# Patient Record
Sex: Female | Born: 1950 | Race: White | Hispanic: No | Marital: Married | Smoking: Never smoker
Health system: Southern US, Community
[De-identification: ages and names within clinical notes are randomized; demographics above are authoritative.]

## PROBLEM LIST (undated history)

## (undated) DIAGNOSIS — T8140XA Infection following a procedure, unspecified, initial encounter: Secondary | ICD-10-CM

## (undated) DIAGNOSIS — T8859XA Other complications of anesthesia, initial encounter: Secondary | ICD-10-CM

## (undated) DIAGNOSIS — I10 Essential (primary) hypertension: Secondary | ICD-10-CM

## (undated) DIAGNOSIS — A4902 Methicillin resistant Staphylococcus aureus infection, unspecified site: Secondary | ICD-10-CM

## (undated) DIAGNOSIS — C801 Malignant (primary) neoplasm, unspecified: Secondary | ICD-10-CM

## (undated) DIAGNOSIS — T4145XA Adverse effect of unspecified anesthetic, initial encounter: Secondary | ICD-10-CM

## (undated) DIAGNOSIS — Z9889 Other specified postprocedural states: Secondary | ICD-10-CM

## (undated) DIAGNOSIS — R112 Nausea with vomiting, unspecified: Secondary | ICD-10-CM

## (undated) HISTORY — PX: TUBAL LIGATION: SHX77

---

## 2015-08-12 DIAGNOSIS — C801 Malignant (primary) neoplasm, unspecified: Secondary | ICD-10-CM

## 2015-08-12 DIAGNOSIS — A4902 Methicillin resistant Staphylococcus aureus infection, unspecified site: Secondary | ICD-10-CM

## 2015-08-12 HISTORY — DX: Malignant (primary) neoplasm, unspecified: C80.1

## 2015-08-12 HISTORY — PX: BREAST LUMPECTOMY: SHX2

## 2015-08-12 HISTORY — DX: Methicillin resistant Staphylococcus aureus infection, unspecified site: A49.02

## 2015-08-12 HISTORY — PX: BREAST SURGERY: SHX581

## 2017-12-09 HISTORY — PX: BREAST EXCISIONAL BIOPSY: SUR124

## 2017-12-11 ENCOUNTER — Other Ambulatory Visit: Payer: Self-pay | Admitting: Hematology & Oncology

## 2017-12-11 DIAGNOSIS — N6310 Unspecified lump in the right breast, unspecified quadrant: Secondary | ICD-10-CM

## 2017-12-11 DIAGNOSIS — R921 Mammographic calcification found on diagnostic imaging of breast: Secondary | ICD-10-CM

## 2017-12-16 ENCOUNTER — Ambulatory Visit
Admission: RE | Admit: 2017-12-16 | Discharge: 2017-12-16 | Disposition: A | Discharge status: Home / Self Care | Payer: Medicare Other | Source: Ambulatory Visit | Attending: Hematology & Oncology | Admitting: Hematology & Oncology

## 2017-12-16 DIAGNOSIS — N6310 Unspecified lump in the right breast, unspecified quadrant: Secondary | ICD-10-CM

## 2017-12-16 DIAGNOSIS — R921 Mammographic calcification found on diagnostic imaging of breast: Secondary | ICD-10-CM

## 2017-12-28 ENCOUNTER — Ambulatory Visit: Payer: Self-pay | Admitting: Surgery

## 2017-12-28 DIAGNOSIS — D241 Benign neoplasm of right breast: Secondary | ICD-10-CM

## 2017-12-28 NOTE — H&P (Signed)
Wendy Kane Documented: 12/28/2017 9:47 AM Location: [facility] Patient #: [id number] DOB: 1950-09-11 Married / Language: English / Race: White Female  History of Present Illness (Wendy Hilgert A. Bodin Gorka MD; 12/28/2017 10:59 AM) Patient words: papilloma     Patient is at at the request of [facility] [place] due to abnormal mammogram. The patient is a history of right breast cancer treated in the [place] in 2017 with lumpectomy and sentinel lymph node mapping on the right. 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 Of note it was placed and had to be removed afterwards. This is quite dramatic for her. She follows up with impression of [place] every 6 months for diagnostic mammography. She was noted to have dystrophic calcifications in the right and a second area of abnormality. Both were core biopsy. The upper outer region showed a papilloma without atypia. The more central reason her lumpectomy site showed dystrophic calcifications. She was sent at the request of the [facility] for lumpectomy for the papilloma given her history. She underwent genetic testing which was negative back in 2017. She has 3 first-degree relatives who had breast cancer in her 66s. Patient denies any recent history of breast pain breast mass or nipple discharge.                         Diagnosis 1. Breast, right, needle core biopsy, 12 o'clock, 3cmfn, slightly medial to midline - SMALL INTRADUCTAL PAPILLOMA. - FIBROCYSTIC CHANGES WITH USUAL DUCTAL HYPERPLASIA. - PSEUDOANGIOMATOUS STROMAL HYPERPLASIA ([place]). - NO EVIDENCE OF MALIGNANCY. 2. Breast, right, needle core biopsy, central upper outer quadrant, along anterior margin of lumpectomy scar, stereotactic - FAT NECROSIS WITH DYSTROPHIC CALCIFICATIONS. - FIBROCYSTIC CHANGES. - NO EVIDENCE OF MALIGNANCY.  The patient is a 67 year old female.   Past  Surgical History Wendy Kane, CMA; 12/28/2017 9:48 AM) Breast Biopsy Right. Breast Mass; Local Excision Right. Colon Polyp Removal - Colonoscopy  Diagnostic Studies History Wendy Kane, CMA; 12/28/2017 9:48 AM) Colonoscopy 1-5 years ago Mammogram within last year Pap Smear 1-5 years ago  Allergies Wendy Kane, CMA; 12/28/2017 9:48 AM) Adhesive Paper *MEDICAL DEVICES AND SUPPLIES* Codeine Phosphate *ANALGESICS - OPIOID*  Medication History (Michelle R. Kane, CMA; 12/28/2017 9:50 AM) Bisoprolol-Hydrochlorothiazide (2.5-6.25MG  Tablet, Oral) Active. Medications Reconciled  Social History Wendy Kane, CMA; 12/28/2017 9:48 AM) Caffeine use Carbonated beverages, Coffee, Tea. No alcohol use No drug use Tobacco use Never smoker.  Family History Wendy Lull R. Wendy Kane, CMA; 12/28/2017 9:48 AM) Breast Cancer Family Members In General. Depression Mother. Diabetes Mellitus Family Members In General. Heart Disease Father. Hypertension Father, Mother. Respiratory Condition Father.  Pregnancy / Birth History Wendy Lull R. Wendy Kane, CMA; 12/28/2017 9:48 AM) Age at menarche 36 years. Age of menopause 31-50 Contraceptive History Oral contraceptives. Gravida 3 Irregular periods Maternal age 23-25 Para 3  Other Problems Wendy Kane, CMA; 12/28/2017 9:48 AM) Anxiety Disorder Breast Cancer General anesthesia - complications Hemorrhoids High blood pressure Lump In Breast     Review of Systems (Wendy Tobey A. Khamille Beynon MD; 12/28/2017 10:59 AM) General Present- Weight Loss. Not Present- Appetite Loss, Chills, Fatigue, Fever, Night Sweats and Weight Gain. Skin Not Present- Change in Wart/Mole, Dryness, Hives, Jaundice, New Lesions, Non-Healing Wounds, Rash and Ulcer. HEENT Present- Hearing Loss, Seasonal Allergies and Wears glasses/contact lenses. Not Present- Earache, Hoarseness, Nose Bleed, Oral Ulcers, Ringing in the Ears, Sinus  Pain, Sore Throat, Visual Disturbances  and Yellow Eyes. Respiratory Present- Snoring. Not Present- Bloody sputum, Chronic Cough, Difficulty Breathing and Wheezing. Breast Present- Breast Mass. Not Present- Breast Pain, Nipple Discharge and Skin Changes. Cardiovascular Not Present- Chest Pain, Difficulty Breathing Lying Down, Leg Cramps, Palpitations, Rapid Heart Rate, Shortness of Breath and Swelling of Extremities. Gastrointestinal Present- Constipation and Hemorrhoids. Not Present- Abdominal Pain, Bloating, Bloody Stool, Change in Bowel Habits, Chronic diarrhea, Difficulty Swallowing, Excessive gas, Gets full quickly at meals, Indigestion, Nausea, Rectal Pain and Vomiting. Female Genitourinary Not Present- Frequency, Nocturia, Painful Urination, Pelvic Pain and Urgency. Musculoskeletal Not Present- Back Pain, Joint Pain, Joint Stiffness, Muscle Pain, Muscle Weakness and Swelling of Extremities. Neurological Not Present- Decreased Memory, Fainting, Headaches, Numbness, Seizures, Tingling, Tremor, Trouble walking and Weakness. Psychiatric Present- Anxiety. Not Present- Bipolar, Change in Sleep Pattern, Depression, Fearful and Frequent crying. Endocrine Not Present- Cold Intolerance, Excessive Hunger, Hair Changes, Heat Intolerance, Hot flashes and New Diabetes. Hematology Not Present- Blood Thinners, Easy Bruising, Excessive bleeding, Gland problems, HIV and Persistent Infections. All other systems negative  Vitals [employer] R. Kane CMA; 12/28/2017 9:47 AM) 12/28/2017 9:47 AM Weight: 193.25 lb Height: 61in Body Surface Area: 1.86 m Body Mass Index: 36.51 kg/m  BP: 130/88 (Sitting, Left Arm, Standard)      Physical Exam (Wendy Knick A. Sheryn Aldaz MD; 12/28/2017 11:00 AM)  General Mental Status-Alert. General Appearance-Consistent with stated age. Hydration-Well hydrated. Voice-Normal.  Head and Neck Head-normocephalic, atraumatic with no lesions or palpable  masses. Trachea-midline. Thyroid Gland Characteristics - normal size and consistency.  Chest and Lung Exam Chest and lung exam reveals -quiet, even and easy respiratory effort with no use of accessory muscles and on auscultation, normal breath sounds, no adventitious sounds and normal vocal resonance. Inspection Chest Wall - Normal. Back - normal.  Breast Note: Right breast is slightly smaller than left. Scar across the breast noted lumpectomy bed. Several nodes site noted. There is some swelling and bruising from the right upper outer quadrant biopsy. The more central biopsies less swollen. Left breast is normal.  Cardiovascular Cardiovascular examination reveals -normal heart sounds, regular rate and rhythm with no murmurs and normal pedal pulses bilaterally.  Neurologic Neurologic evaluation reveals -alert and oriented x 3 with no impairment of recent or remote memory. Mental Status-Normal.  Lymphatic Head & Neck  General Head & Neck Lymphatics: Bilateral - Description - Normal. Axillary  General Axillary Region: Bilateral - Description - Normal. Tenderness - Non Tender.    Assessment & Plan (Wendy Ballengee A. Savaya Hakes MD; 12/28/2017 11:01 AM)  PAPILLOMA OF RIGHT BREAST (D24.1) Impression: Recommend right breast C localized lumpectomy. Had a lengthy discussion about her previous care today and have reviewed her records from [place]. She is was quite traumatized by HER care there and had multiple questions about lumpectomy what is required of this and rationale for further surgery in this circumstance. She has a 1 and 5 risk of upgrade from the papilloma especially with her treatment for breast cancer. She has not had radiation therapy so hopefully her complication rates would be less. Of note she had a previous infection of her lumpectomy site and port site during her previous care. She would like to proceed with right breast C localized lumpectomy. Risk of lumpectomy include  bleeding, infection, seroma, more surgery, use of seed/wire, wound care, cosmetic deformity and the need for other treatments, death , blood clots, death. Pt agrees to proceed.  Current Plans Pt Education - [facility] Breast Biopsy HCI: discussed with patient and provided information. You are  being scheduled for surgery- Our schedulers will call you.  You should hear from our office's scheduling department within 5 working days about the location, date, and time of surgery. We try to make accommodations for patient's preferences in scheduling surgery, but sometimes the OR schedule or the surgeon's schedule prevents [place] from making those accommodations.  If you have not heard from our office [phone]) in 5 working days, call the office and ask for your surgeon's nurse.  If you have other questions about your diagnosis, plan, or surgery, call the office and ask for your surgeon's nurse.

## 2017-12-28 NOTE — H&P (View-Only) (Signed)
Wendy Kane Documented: 12/28/2017 9:47 AM Location: [facility] Patient #: [id number] DOB: 22-May-1951 Married / Language: English / Race: White Female  History of Present Illness (Katelin Kutsch A. Dickie Cloe MD; 12/28/2017 10:59 AM) Patient words: papilloma     Patient is at at the request of [facility] due to abnormal mammogram. The patient is a history of right breast cancer treated in the [place] in [place] with lumpectomy and sentinel lymph node mapping on the right. 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 Of note it was placed and had to be removed afterwards. This is quite dramatic for her. She follows up with [facility] [place] every 6 months for diagnostic mammography. She was noted to have dystrophic calcifications in the right and a second area of abnormality. Both were core biopsy. The upper outer region showed a papilloma without atypia. The more central reason her lumpectomy site showed dystrophic calcifications. She was sent at the request of the [facility] for lumpectomy for the papilloma given her history. She underwent genetic testing which was negative back in 2017. She has 3 first-degree relatives who had breast cancer in her 68s. Patient denies any recent history of breast pain breast mass or nipple discharge.                         Diagnosis 1. Breast, right, needle core biopsy, 12 o'clock, 3cmfn, slightly medial to midline - SMALL INTRADUCTAL PAPILLOMA. - FIBROCYSTIC CHANGES WITH USUAL DUCTAL HYPERPLASIA. - PSEUDOANGIOMATOUS STROMAL HYPERPLASIA ([place]). - NO EVIDENCE OF MALIGNANCY. 2. Breast, right, needle core biopsy, central upper outer quadrant, along anterior margin of lumpectomy scar, stereotactic - FAT NECROSIS WITH DYSTROPHIC CALCIFICATIONS. - FIBROCYSTIC CHANGES. - NO EVIDENCE OF MALIGNANCY.  The patient is a 67 year old female.   Past  Surgical History Sharyn Lull R. Brooks, CMA; 12/28/2017 9:48 AM) Breast Biopsy Right. Breast Mass; Local Excision Right. Colon Polyp Removal - Colonoscopy  Diagnostic Studies History Sharyn Lull R. Brooks, CMA; 12/28/2017 9:48 AM) Colonoscopy 1-5 years ago Mammogram within last year Pap Smear 1-5 years ago  Allergies Sharyn Lull R. Brooks, CMA; 12/28/2017 9:48 AM) Adhesive Paper *MEDICAL DEVICES AND SUPPLIES* Codeine Phosphate *ANALGESICS - OPIOID*  Medication History (Michelle R. Brooks, CMA; 12/28/2017 9:50 AM) Bisoprolol-Hydrochlorothiazide (2.5-6.25MG  Tablet, Oral) Active. Medications Reconciled  Social History Sharyn Lull R. Brooks, CMA; 12/28/2017 9:48 AM) Caffeine use Carbonated beverages, Coffee, Tea. No alcohol use No drug use Tobacco use Never smoker.  Family History Sharyn Lull R. Rolena Infante, CMA; 12/28/2017 9:48 AM) Breast Cancer Family Members In General. Depression Mother. Diabetes Mellitus Family Members In General. Heart Disease Father. Hypertension Father, Mother. Respiratory Condition Father.  Pregnancy / Birth History Sharyn Lull R. Rolena Infante, CMA; 12/28/2017 9:48 AM) Age at menarche 74 years. Age of menopause 63-50 Contraceptive History Oral contraceptives. Gravida 3 Irregular periods Maternal age 45-25 Para 3  Other Problems Sharyn Lull R. Brooks, CMA; 12/28/2017 9:48 AM) Anxiety Disorder Breast Cancer General anesthesia - complications Hemorrhoids High blood pressure Lump In Breast     Review of Systems (Taryn Nave A. Meranda Dechaine MD; 12/28/2017 10:59 AM) General Present- Weight Loss. Not Present- Appetite Loss, Chills, Fatigue, Fever, Night Sweats and Weight Gain. Skin Not Present- Change in Wart/Mole, Dryness, Hives, Jaundice, New Lesions, Non-Healing Wounds, Rash and Ulcer. HEENT Present- Hearing Loss, Seasonal Allergies and Wears glasses/contact lenses. Not Present- Earache, Hoarseness, Nose Bleed, Oral Ulcers, Ringing in the Ears, Sinus  Pain, Sore Throat, Visual Disturbances  and Yellow Eyes. Respiratory Present- Snoring. Not Present- Bloody sputum, Chronic Cough, Difficulty Breathing and Wheezing. Breast Present- Breast Mass. Not Present- Breast Pain, Nipple Discharge and Skin Changes. Cardiovascular Not Present- Chest Pain, Difficulty Breathing Lying Down, Leg Cramps, Palpitations, Rapid Heart Rate, Shortness of Breath and Swelling of Extremities. Gastrointestinal Present- Constipation and Hemorrhoids. Not Present- Abdominal Pain, Bloating, Bloody Stool, Change in Bowel Habits, Chronic diarrhea, Difficulty Swallowing, Excessive gas, Gets full quickly at meals, Indigestion, Nausea, Rectal Pain and Vomiting. Female Genitourinary Not Present- Frequency, Nocturia, Painful Urination, Pelvic Pain and Urgency. Musculoskeletal Not Present- Back Pain, Joint Pain, Joint Stiffness, Muscle Pain, Muscle Weakness and Swelling of Extremities. Neurological Not Present- Decreased Memory, Fainting, Headaches, Numbness, Seizures, Tingling, Tremor, Trouble walking and Weakness. Psychiatric Present- Anxiety. Not Present- Bipolar, Change in Sleep Pattern, Depression, Fearful and Frequent crying. Endocrine Not Present- Cold Intolerance, Excessive Hunger, Hair Changes, Heat Intolerance, Hot flashes and New Diabetes. Hematology Not Present- Blood Thinners, Easy Bruising, Excessive bleeding, Gland problems, HIV and Persistent Infections. All other systems negative  Vitals [employer] R. Brooks CMA; 12/28/2017 9:47 AM) 12/28/2017 9:47 AM Weight: 193.25 lb Height: 61in Body Surface Area: 1.86 m Body Mass Index: 36.51 kg/m  BP: 130/88 (Sitting, Left Arm, Standard)      Physical Exam (Jujhar Everett A. Mackenzie Lia MD; 12/28/2017 11:00 AM)  General Mental Status-Alert. General Appearance-Consistent with stated age. Hydration-Well hydrated. Voice-Normal.  Head and Neck Head-normocephalic, atraumatic with no lesions or palpable  masses. Trachea-midline. Thyroid Gland Characteristics - normal size and consistency.  Chest and Lung Exam Chest and lung exam reveals -quiet, even and easy respiratory effort with no use of accessory muscles and on auscultation, normal breath sounds, no adventitious sounds and normal vocal resonance. Inspection Chest Wall - Normal. Back - normal.  Breast Note: Right breast is slightly smaller than left. Scar across the breast noted lumpectomy bed. Several nodes site noted. There is some swelling and bruising from the right upper outer quadrant biopsy. The more central biopsies less swollen. Left breast is normal.  Cardiovascular Cardiovascular examination reveals -normal heart sounds, regular rate and rhythm with no murmurs and normal pedal pulses bilaterally.  Neurologic Neurologic evaluation reveals -alert and oriented x 3 with no impairment of recent or remote memory. Mental Status-Normal.  Lymphatic Head & Neck  General Head & Neck Lymphatics: Bilateral - Description - Normal. Axillary  General Axillary Region: Bilateral - Description - Normal. Tenderness - Non Tender.    Assessment & Plan (Sorin Frimpong A. Breindel Collier MD; 12/28/2017 11:01 AM)  PAPILLOMA OF RIGHT BREAST (D24.1) Impression: Recommend right breast C localized lumpectomy. Had a lengthy discussion about her previous care today and have reviewed her records from [place]. She is was quite traumatized by HER care there and had multiple questions about lumpectomy what is required of this and rationale for further surgery in this circumstance. She has a 1 and 5 risk of upgrade from the papilloma especially with her treatment for breast cancer. She has not had radiation therapy so hopefully her complication rates would be less. Of note she had a previous infection of her lumpectomy site and port site during her previous care. She would like to proceed with right breast C localized lumpectomy. Risk of lumpectomy include  bleeding, infection, seroma, more surgery, use of seed/wire, wound care, cosmetic deformity and the need for other treatments, death , blood clots, death. Pt agrees to proceed.  Current Plans Pt Education - [facility] Breast Biopsy HCI: discussed with patient and provided information. You are  being scheduled for surgery- Our schedulers will call you.  You should hear from our office's scheduling department within 5 working days about the location, date, and time of surgery. We try to make accommodations for patient's preferences in scheduling surgery, but sometimes the OR schedule or the surgeon's schedule prevents [place] from making those accommodations.  If you have not heard from our office [phone]) in 5 working days, call the office and ask for your surgeon's nurse.  If you have other questions about your diagnosis, plan, or surgery, call the office and ask for your surgeon's nurse.

## 2017-12-29 ENCOUNTER — Other Ambulatory Visit: Payer: Self-pay | Admitting: Surgery

## 2017-12-29 DIAGNOSIS — D241 Benign neoplasm of right breast: Secondary | ICD-10-CM

## 2017-12-30 ENCOUNTER — Encounter (HOSPITAL_BASED_OUTPATIENT_CLINIC_OR_DEPARTMENT_OTHER): Payer: Self-pay | Admitting: *Deleted

## 2017-12-30 ENCOUNTER — Other Ambulatory Visit: Payer: Self-pay

## 2017-12-31 ENCOUNTER — Other Ambulatory Visit: Payer: Self-pay

## 2017-12-31 ENCOUNTER — Encounter (HOSPITAL_BASED_OUTPATIENT_CLINIC_OR_DEPARTMENT_OTHER)
Admission: RE | Admit: 2017-12-31 | Discharge: 2017-12-31 | Disposition: A | Discharge status: Home / Self Care | Payer: Medicare Other | Source: Ambulatory Visit | Attending: Surgery | Admitting: Surgery

## 2017-12-31 DIAGNOSIS — Z01812 Encounter for preprocedural laboratory examination: Secondary | ICD-10-CM | POA: Insufficient documentation

## 2017-12-31 DIAGNOSIS — Z01810 Encounter for preprocedural cardiovascular examination: Secondary | ICD-10-CM | POA: Insufficient documentation

## 2017-12-31 DIAGNOSIS — R9431 Abnormal electrocardiogram [ECG] [EKG]: Secondary | ICD-10-CM | POA: Diagnosis not present

## 2017-12-31 DIAGNOSIS — I10 Essential (primary) hypertension: Secondary | ICD-10-CM | POA: Diagnosis not present

## 2017-12-31 LAB — BASIC METABOLIC PANEL
Anion gap: 9 (ref 5–15)
BUN: 12 mg/dL (ref 6–20)
CO2: 29 mmol/L (ref 22–32)
CREATININE: 0.8 mg/dL (ref 0.44–1.00)
Calcium: 10.1 mg/dL (ref 8.9–10.3)
Chloride: 102 mmol/L (ref 101–111)
GFR calc Af Amer: >60 mL/min (ref >60)
GFR calc non Af Amer: >60 mL/min (ref >60)
GLUCOSE: 107 mg/dL — AB (ref 65–99)
POTASSIUM: 5 mmol/L (ref 3.5–5.1)
SODIUM: 140 mmol/L (ref 135–145)

## 2018-01-05 ENCOUNTER — Ambulatory Visit
Admission: RE | Admit: 2018-01-05 | Discharge: 2018-01-05 | Disposition: A | Discharge status: Home / Self Care | Payer: Medicare Other | Source: Ambulatory Visit | Attending: Surgery | Admitting: Surgery

## 2018-01-05 DIAGNOSIS — D241 Benign neoplasm of right breast: Secondary | ICD-10-CM

## 2018-01-06 ENCOUNTER — Ambulatory Visit (HOSPITAL_BASED_OUTPATIENT_CLINIC_OR_DEPARTMENT_OTHER): Payer: Medicare Other | Admitting: Certified Registered"

## 2018-01-06 ENCOUNTER — Ambulatory Visit
Admission: RE | Admit: 2018-01-06 | Discharge: 2018-01-06 | Disposition: A | Discharge status: Home / Self Care | Payer: Medicare Other | Source: Ambulatory Visit | Attending: Surgery | Admitting: Surgery

## 2018-01-06 ENCOUNTER — Other Ambulatory Visit: Payer: Self-pay

## 2018-01-06 ENCOUNTER — Ambulatory Visit (HOSPITAL_BASED_OUTPATIENT_CLINIC_OR_DEPARTMENT_OTHER)
Admission: RE | Admit: 2018-01-06 | Discharge: 2018-01-06 | Disposition: A | Discharge status: Home / Self Care | Payer: Medicare Other | Source: Ambulatory Visit | Attending: Surgery | Admitting: Surgery

## 2018-01-06 ENCOUNTER — Encounter (HOSPITAL_BASED_OUTPATIENT_CLINIC_OR_DEPARTMENT_OTHER)
Admission: RE | Disposition: A | Discharge status: Home / Self Care | Payer: Self-pay | Source: Ambulatory Visit | Attending: Surgery

## 2018-01-06 ENCOUNTER — Encounter (HOSPITAL_BASED_OUTPATIENT_CLINIC_OR_DEPARTMENT_OTHER): Payer: Self-pay | Admitting: *Deleted

## 2018-01-06 DIAGNOSIS — Z853 Personal history of malignant neoplasm of breast: Secondary | ICD-10-CM | POA: Insufficient documentation

## 2018-01-06 DIAGNOSIS — I10 Essential (primary) hypertension: Secondary | ICD-10-CM | POA: Diagnosis not present

## 2018-01-06 DIAGNOSIS — N6489 Other specified disorders of breast: Secondary | ICD-10-CM | POA: Diagnosis not present

## 2018-01-06 DIAGNOSIS — D241 Benign neoplasm of right breast: Secondary | ICD-10-CM | POA: Diagnosis present

## 2018-01-06 DIAGNOSIS — Z803 Family history of malignant neoplasm of breast: Secondary | ICD-10-CM | POA: Insufficient documentation

## 2018-01-06 HISTORY — DX: Essential (primary) hypertension: I10

## 2018-01-06 HISTORY — DX: Other specified postprocedural states: Z98.890

## 2018-01-06 HISTORY — DX: Infection following a procedure, unspecified, initial encounter: T81.40XA

## 2018-01-06 HISTORY — DX: Methicillin resistant Staphylococcus aureus infection, unspecified site: A49.02

## 2018-01-06 HISTORY — DX: Malignant (primary) neoplasm, unspecified: C80.1

## 2018-01-06 HISTORY — DX: Adverse effect of unspecified anesthetic, initial encounter: T41.45XA

## 2018-01-06 HISTORY — DX: Other specified postprocedural states: R11.2

## 2018-01-06 HISTORY — PX: BREAST LUMPECTOMY WITH RADIOACTIVE SEED LOCALIZATION: SHX6424

## 2018-01-06 HISTORY — DX: Other complications of anesthesia, initial encounter: T88.59XA

## 2018-01-06 SURGERY — BREAST LUMPECTOMY WITH RADIOACTIVE SEED LOCALIZATION
Anesthesia: General | Site: Breast | Laterality: Right

## 2018-01-06 MED ORDER — KETOROLAC TROMETHAMINE 30 MG/ML IJ SOLN
INTRAMUSCULAR | Status: AC
  Filled 2018-01-06: qty 1

## 2018-01-06 MED ORDER — GABAPENTIN 300 MG PO CAPS
ORAL_CAPSULE | ORAL | Status: AC
Start: 2018-01-06 — End: ?
  Filled 2018-01-06: qty 1

## 2018-01-06 MED ORDER — BUPIVACAINE-EPINEPHRINE (PF) 0.25% -1:200000 IJ SOLN
INTRAMUSCULAR | Status: AC
  Filled 2018-01-06: qty 30

## 2018-01-06 MED ORDER — SCOPOLAMINE 1 MG/3DAYS TD PT72
1.0000 | MEDICATED_PATCH | TRANSDERMAL | Status: DC
  Administered 2018-01-06: 1.5 mg via TRANSDERMAL

## 2018-01-06 MED ORDER — CHLORHEXIDINE GLUCONATE CLOTH 2 % EX PADS: 6.0000 | MEDICATED_PAD | Freq: Once | CUTANEOUS | Status: DC

## 2018-01-06 MED ORDER — SCOPOLAMINE 1 MG/3DAYS TD PT72
MEDICATED_PATCH | TRANSDERMAL | Status: AC
  Filled 2018-01-06: qty 1

## 2018-01-06 MED ORDER — BUPIVACAINE-EPINEPHRINE (PF) 0.25% -1:200000 IJ SOLN
INTRAMUSCULAR | Status: DC | PRN
  Administered 2018-01-06: 19 mL via PERINEURAL

## 2018-01-06 MED ORDER — LACTATED RINGERS IV SOLN
INTRAVENOUS | Status: DC
  Administered 2018-01-06 (×2): via INTRAVENOUS

## 2018-01-06 MED ORDER — MIDAZOLAM HCL 5 MG/5ML IJ SOLN
INTRAMUSCULAR | Status: DC | PRN
  Administered 2018-01-06: 2 mg via INTRAVENOUS

## 2018-01-06 MED ORDER — SCOPOLAMINE 1 MG/3DAYS TD PT72: 1.0000 | MEDICATED_PATCH | Freq: Once | TRANSDERMAL | Status: DC | PRN

## 2018-01-06 MED ORDER — ONDANSETRON HCL 4 MG/2ML IJ SOLN
INTRAMUSCULAR | Status: AC
Start: 2018-01-06 — End: ?
  Filled 2018-01-06: qty 2

## 2018-01-06 MED ORDER — DEXAMETHASONE SODIUM PHOSPHATE 10 MG/ML IJ SOLN
INTRAMUSCULAR | Status: AC
  Filled 2018-01-06: qty 1

## 2018-01-06 MED ORDER — FENTANYL CITRATE (PF) 100 MCG/2ML IJ SOLN
INTRAMUSCULAR | Status: DC | PRN
  Administered 2018-01-06: 50 ug via INTRAVENOUS

## 2018-01-06 MED ORDER — DEXTROSE 5 % IV SOLN: 3.0000 g | INTRAVENOUS | Status: DC

## 2018-01-06 MED ORDER — ONDANSETRON HCL 4 MG/2ML IJ SOLN
INTRAMUSCULAR | Status: AC
  Filled 2018-01-06: qty 2

## 2018-01-06 MED ORDER — CLINDAMYCIN PHOSPHATE 900 MG/50ML IV SOLN
INTRAVENOUS | Status: AC
  Filled 2018-01-06: qty 50

## 2018-01-06 MED ORDER — CEFAZOLIN SODIUM-DEXTROSE 2-4 GM/100ML-% IV SOLN
INTRAVENOUS | Status: AC
  Filled 2018-01-06: qty 100

## 2018-01-06 MED ORDER — CELECOXIB 200 MG PO CAPS
200.0000 mg | ORAL_CAPSULE | ORAL | Status: AC
  Administered 2018-01-06: 200 mg via ORAL

## 2018-01-06 MED ORDER — FAMOTIDINE 20 MG PO TABS
20.0000 mg | ORAL_TABLET | Freq: Once | ORAL | Status: AC
  Administered 2018-01-06: 20 mg via ORAL

## 2018-01-06 MED ORDER — FENTANYL CITRATE (PF) 100 MCG/2ML IJ SOLN: 25.0000 ug | INTRAMUSCULAR | Status: DC | PRN

## 2018-01-06 MED ORDER — FAMOTIDINE 20 MG PO TABS
ORAL_TABLET | ORAL | Status: AC
  Filled 2018-01-06: qty 1

## 2018-01-06 MED ORDER — LIDOCAINE 2% (20 MG/ML) 5 ML SYRINGE
INTRAMUSCULAR | Status: DC | PRN
Start: 2018-01-06 — End: 2018-01-06
  Administered 2018-01-06: 60 mg via INTRAVENOUS

## 2018-01-06 MED ORDER — MIDAZOLAM HCL 2 MG/2ML IJ SOLN: 1.0000 mg | INTRAMUSCULAR | Status: DC | PRN

## 2018-01-06 MED ORDER — DEXAMETHASONE SODIUM PHOSPHATE 10 MG/ML IJ SOLN
INTRAMUSCULAR | Status: DC | PRN
  Administered 2018-01-06: 10 mg via INTRAVENOUS

## 2018-01-06 MED ORDER — ACETAMINOPHEN 500 MG PO TABS
1000.0000 mg | ORAL_TABLET | ORAL | Status: AC
  Administered 2018-01-06: 1000 mg via ORAL

## 2018-01-06 MED ORDER — ONDANSETRON HCL 4 MG/2ML IJ SOLN
INTRAMUSCULAR | Status: DC | PRN
  Administered 2018-01-06 (×2): 4 mg via INTRAVENOUS

## 2018-01-06 MED ORDER — MIDAZOLAM HCL 2 MG/2ML IJ SOLN
INTRAMUSCULAR | Status: AC
  Filled 2018-01-06: qty 2

## 2018-01-06 MED ORDER — CLINDAMYCIN PHOSPHATE 900 MG/50ML IV SOLN
900.0000 mg | Freq: Once | INTRAVENOUS | Status: AC
  Administered 2018-01-06: 900 mg via INTRAVENOUS

## 2018-01-06 MED ORDER — FENTANYL CITRATE (PF) 100 MCG/2ML IJ SOLN
INTRAMUSCULAR | Status: AC
  Filled 2018-01-06: qty 2

## 2018-01-06 MED ORDER — PROPOFOL 10 MG/ML IV BOLUS
INTRAVENOUS | Status: DC | PRN
  Administered 2018-01-06: 100 mg via INTRAVENOUS

## 2018-01-06 MED ORDER — CELECOXIB 200 MG PO CAPS
ORAL_CAPSULE | ORAL | Status: AC
  Filled 2018-01-06: qty 1

## 2018-01-06 MED ORDER — GABAPENTIN 300 MG PO CAPS
300.0000 mg | ORAL_CAPSULE | ORAL | Status: AC
  Administered 2018-01-06: 300 mg via ORAL

## 2018-01-06 MED ORDER — PHENYLEPHRINE 40 MCG/ML (10ML) SYRINGE FOR IV PUSH (FOR BLOOD PRESSURE SUPPORT)
PREFILLED_SYRINGE | INTRAVENOUS | Status: DC | PRN
  Administered 2018-01-06: 80 ug via INTRAVENOUS
  Administered 2018-01-06: 40 ug via INTRAVENOUS
  Administered 2018-01-06: 80 ug via INTRAVENOUS
  Administered 2018-01-06: 40 ug via INTRAVENOUS

## 2018-01-06 MED ORDER — ONDANSETRON HCL 4 MG/2ML IJ SOLN: 4.0000 mg | Freq: Once | INTRAMUSCULAR | Status: DC | PRN

## 2018-01-06 MED ORDER — ACETAMINOPHEN 500 MG PO TABS
ORAL_TABLET | ORAL | Status: AC
  Filled 2018-01-06: qty 2

## 2018-01-06 MED ORDER — EPHEDRINE SULFATE-NACL 50-0.9 MG/10ML-% IV SOSY
PREFILLED_SYRINGE | INTRAVENOUS | Status: DC | PRN
  Administered 2018-01-06: 5 mg via INTRAVENOUS
  Administered 2018-01-06: 10 mg via INTRAVENOUS
  Administered 2018-01-06: 5 mg via INTRAVENOUS
  Administered 2018-01-06: 10 mg via INTRAVENOUS
  Administered 2018-01-06: 5 mg via INTRAVENOUS

## 2018-01-06 MED ORDER — IBUPROFEN 800 MG PO TABS: 800.0000 mg | ORAL_TABLET | Freq: Three times a day (TID) | ORAL | 0 refills | Status: AC | PRN

## 2018-01-06 MED ORDER — FENTANYL CITRATE (PF) 100 MCG/2ML IJ SOLN: 50.0000 ug | INTRAMUSCULAR | Status: DC | PRN

## 2018-01-06 SURGICAL SUPPLY — 47 items
APPLIER CLIP 9.375 MED OPEN (MISCELLANEOUS)
BINDER BREAST LRG (GAUZE/BANDAGES/DRESSINGS) IMPLANT
BINDER BREAST MEDIUM (GAUZE/BANDAGES/DRESSINGS) IMPLANT
BINDER BREAST XLRG (GAUZE/BANDAGES/DRESSINGS) IMPLANT
BINDER BREAST XXLRG (GAUZE/BANDAGES/DRESSINGS) IMPLANT
BLADE SURG 15 STRL LF DISP TIS (BLADE) ×1 IMPLANT
BLADE SURG 15 STRL SS (BLADE) ×2
CANISTER SUC SOCK COL 7IN (MISCELLANEOUS) IMPLANT
CANISTER SUCT 1200ML W/VALVE (MISCELLANEOUS) IMPLANT
CHLORAPREP W/TINT 26ML (MISCELLANEOUS) ×3 IMPLANT
CLIP APPLIE 9.375 MED OPEN (MISCELLANEOUS) IMPLANT
COVER BACK TABLE 60X90IN (DRAPES) ×3 IMPLANT
COVER MAYO STAND STRL (DRAPES) ×3 IMPLANT
COVER PROBE W GEL 5X96 (DRAPES) ×3 IMPLANT
DECANTER SPIKE VIAL GLASS SM (MISCELLANEOUS) IMPLANT
DERMABOND ADVANCED (GAUZE/BANDAGES/DRESSINGS) ×2
DERMABOND ADVANCED .7 DNX12 (GAUZE/BANDAGES/DRESSINGS) ×1 IMPLANT
DEVICE DUBIN W/COMP PLATE 8390 (MISCELLANEOUS) ×3 IMPLANT
DRAPE LAPAROSCOPIC ABDOMINAL (DRAPES) IMPLANT
DRAPE LAPAROTOMY 100X72 PEDS (DRAPES) ×3 IMPLANT
DRAPE UTILITY XL STRL (DRAPES) ×3 IMPLANT
ELECT COATED BLADE 2.86 ST (ELECTRODE) ×3 IMPLANT
ELECT REM PT RETURN 9FT ADLT (ELECTROSURGICAL) ×3
ELECTRODE REM PT RTRN 9FT ADLT (ELECTROSURGICAL) ×1 IMPLANT
GLOVE BIOGEL PI IND STRL 8 (GLOVE) ×1 IMPLANT
GLOVE BIOGEL PI INDICATOR 8 (GLOVE) ×2
GLOVE ECLIPSE 8.0 STRL XLNG CF (GLOVE) ×3 IMPLANT
GOWN STRL REUS W/ TWL LRG LVL3 (GOWN DISPOSABLE) ×2 IMPLANT
GOWN STRL REUS W/TWL LRG LVL3 (GOWN DISPOSABLE) ×4
HEMOSTAT ARISTA ABSORB 3G PWDR (MISCELLANEOUS) IMPLANT
HEMOSTAT SNOW SURGICEL 2X4 (HEMOSTASIS) IMPLANT
KIT MARKER MARGIN INK (KITS) ×3 IMPLANT
NEEDLE HYPO 25X1 1.5 SAFETY (NEEDLE) ×3 IMPLANT
NS IRRIG 1000ML POUR BTL (IV SOLUTION) ×3 IMPLANT
PACK BASIN DAY SURGERY FS (CUSTOM PROCEDURE TRAY) ×3 IMPLANT
PENCIL BUTTON HOLSTER BLD 10FT (ELECTRODE) ×3 IMPLANT
SLEEVE SCD COMPRESS KNEE MED (MISCELLANEOUS) ×3 IMPLANT
SPONGE LAP 4X18 RFD (DISPOSABLE) ×3 IMPLANT
SUT MNCRL AB 4-0 PS2 18 (SUTURE) ×3 IMPLANT
SUT SILK 2 0 SH (SUTURE) IMPLANT
SUT VICRYL 3-0 CR8 SH (SUTURE) ×3 IMPLANT
SYR CONTROL 10ML LL (SYRINGE) ×3 IMPLANT
TOWEL GREEN STERILE FF (TOWEL DISPOSABLE) ×3 IMPLANT
TOWEL OR NON WOVEN STRL DISP B (DISPOSABLE) ×3 IMPLANT
TUBE CONNECTING 20'X1/4 (TUBING) ×1
TUBE CONNECTING 20X1/4 (TUBING) ×2 IMPLANT
YANKAUER SUCT BULB TIP NO VENT (SUCTIONS) ×3 IMPLANT

## 2018-01-06 NOTE — Interval H&P Note (Signed)
History and Physical Interval Note:  01/06/2018 11:58 AM  Wendy Kane  has presented today for surgery, with the diagnosis of PAPILLOMA  The various methods of treatment have been discussed with the patient and family. After consideration of risks, benefits and other options for treatment, the patient has consented to  Procedure(s): BREAST LUMPECTOMY WITH RADIOACTIVE SEED LOCALIZATION (Right) as a surgical intervention .  The patient's history has been reviewed, patient examined, no change in status, stable for surgery.  I have reviewed the patient's chart and labs.  Questions were answered to the patient's satisfaction.     [place]

## 2018-01-06 NOTE — Anesthesia Postprocedure Evaluation (Signed)
Anesthesia Post Note  Patient: Wendy Kane  Procedure(s) Performed: BREAST LUMPECTOMY WITH RADIOACTIVE SEED LOCALIZATION (Right Breast)     Patient location during evaluation: PACU Anesthesia Type: General Level of consciousness: awake and alert Pain management: pain level controlled Vital Signs Assessment: post-procedure vital signs reviewed and stable Respiratory status: spontaneous breathing, nonlabored ventilation, respiratory function stable and patient connected to nasal cannula oxygen Cardiovascular status: blood pressure returned to baseline and stable Postop Assessment: no apparent nausea or vomiting Anesthetic complications: no    Last Vitals:  Vitals:   01/06/18 1300 01/06/18 1328  BP: 127/74 132/73  Pulse: 68 71  Resp: 16 16  Temp:  36.6 C  SpO2: 94% 100%    Last Pain:  Vitals:   01/06/18 1328  TempSrc:   PainSc: 0-No pain                 Ryan P Ellender

## 2018-01-06 NOTE — Op Note (Signed)
Preoperative diagnosis: Right breast papilloma  Postoperative diagnosis: Same  Procedure: Right breast seed localized lumpectomy  Surgeon: Erroll Luna, MD  Anesthesia: LMA with local  EBL: 10 cc  Specimen: Right breast tissue with seed and clip verified by Faxitron  Drains: None  IV fluids: Per anesthesia record  Indications for procedure: The patient presents for excision of a right breast papilloma secondary to core biopsy results which showed papilloma and previous history of right breast cancer.  Risk, benefits and other options discussed as well as potential risk for upgrade of lesion to a malignant lesion.The procedure has been discussed with the patient. Alternatives to surgery have been discussed with the patient.  Risks of surgery include bleeding,  Infection,  Seroma formation, death,  and the need for further surgery.   The patient understands and wishes to proceed.     Description of procedure: The patient was met in the holding area.  Neoprobe used to verify the seed location in the right breast and questions were answered.  Right side was marked as correct.  She was taken back to the operating room.  She was placed supine upon the operating room table.  After induction of LMA anesthesia, right breast was prepped and draped in sterile fashion.  Timeout was done.  Neoprobe was used and see was identified in the right breast just above the nipple complex.  Local anesthetic was infiltrated and a curvilinear incision was made along the superior border of the nipple areolar complex.  Dissection was carried down and the seed and clip in the tissue around it were excised using the neoprobe as a guide.  The specimen was oriented with ink and passed off the field.  Faxitron image revealed both seed and clip to be in the specimen.  The wound was made hemostatic with cautery and closed with 3-0 Vicryl and 4-0 Monocryl.  Dermabond applied.  All final counts found to be correct of sponge,  needles and instruments.  Patient was awoke extubated taken to recovery in satisfactory condition.

## 2018-01-06 NOTE — Transfer of Care (Signed)
Immediate Anesthesia Transfer of Care Note  Patient: Wendy Kane  Procedure(s) Performed: BREAST LUMPECTOMY WITH RADIOACTIVE SEED LOCALIZATION (Right Breast)  Patient Location: PACU  Anesthesia Type:General  Level of Consciousness: drowsy  Airway & Oxygen Therapy: Patient Spontanous Breathing and Patient connected to face mask oxygen  Post-op Assessment: Report given to RN and Post -op Vital signs reviewed and stable  Post vital signs: Reviewed and stable  Last Vitals:  Vitals Value Taken Time  BP 132/78 01/06/2018 12:02 PM  Temp    Pulse 78 01/06/2018 12:04 PM  Resp 22 01/06/2018 12:04 PM  SpO2 100 % 01/06/2018 12:04 PM  Vitals shown include unvalidated device data.  Last Pain:  Vitals:   01/06/18 1020  TempSrc: Oral  PainSc: 0-No pain      Patients Stated Pain Goal: 0 (03/24/87 [id number])  Complications: No apparent anesthesia complications

## 2018-01-06 NOTE — Anesthesia Procedure Notes (Signed)
Procedure Name: LMA Insertion Date/Time: 01/06/2018 11:17 AM Performed by: Gwyndolyn Saxon, CRNA Pre-anesthesia Checklist: Patient identified, Emergency Drugs available, Suction available, Patient being monitored and Timeout performed Patient Re-evaluated:Patient Re-evaluated prior to induction Oxygen Delivery Method: Circle system utilized Preoxygenation: Pre-oxygenation with 100% oxygen Induction Type: IV induction Ventilation: Mask ventilation without difficulty LMA: LMA inserted LMA Size: 4.0 Number of attempts: 1 Placement Confirmation: positive ETCO2,  CO2 detector and breath sounds checked- equal and bilateral Tube secured with: Tape Dental Injury: Teeth and Oropharynx as per pre-operative assessment

## 2018-01-06 NOTE — Discharge Instructions (Signed)
[place] Office Phone Number [phone]  BREAST BIOPSY/ Lumpectomy: POST OP INSTRUCTIONS  Always review your discharge instruction sheet given to you by the facility where your surgery was performed.  IF YOU HAVE DISABILITY OR FAMILY LEAVE FORMS, YOU MUST BRING THEM TO THE OFFICE FOR PROCESSING.  DO NOT GIVE THEM TO YOUR DOCTOR.  1. A prescription for pain medication may be given to you upon discharge.  Take your pain medication as prescribed, if needed.  If narcotic pain medicine is not needed, then you may take acetaminophen (Tylenol) or ibuprofen (Advil) as needed. 2. Take your usually prescribed medications unless otherwise directed 3. If you need a refill on your pain medication, please contact your pharmacy.  They will contact our office to request authorization.  Prescriptions will not be filled after 5pm or on week-ends. 4. You should eat very light the first 24 hours after surgery, such as soup, crackers, pudding, etc.  Resume your normal diet the day after surgery. 5. Most patients will experience some swelling and bruising in the breast.  Ice packs and a good support bra will help.  Swelling and bruising can take several days to resolve.  6. It is common to experience some constipation if taking pain medication after surgery.  Increasing fluid intake and taking a stool softener will usually help or prevent this problem from occurring.  A mild laxative (Milk of Magnesia or Miralax) should be taken according to package directions if there are no bowel movements after 48 hours. 7. Unless discharge instructions indicate otherwise, you may remove your bandages 24-48 hours after surgery, and you may shower at that time.  You may have steri-strips (small skin tapes) in place directly over the incision.  These strips should be left on the skin for 7-10 days.  If your surgeon used skin glue on the incision, you may shower in 24 hours.  The glue will flake off over the next 2-3  weeks.  Any sutures or staples will be removed at the office during your follow-up visit. 8. ACTIVITIES:  You may resume regular daily activities (gradually increasing) beginning the next day.  Wearing a good support bra or sports bra minimizes pain and swelling.  You may have sexual intercourse when it is comfortable. a. You may drive when you no longer are taking prescription pain medication, you can comfortably wear a seatbelt, and you can safely maneuver your car and apply brakes. b. RETURN TO WORK:  ______________________________________________________________________________________ 9. You should see your doctor in the office for a follow-up appointment approximately two weeks after your surgery.  Your doctors nurse will typically make your follow-up appointment when she calls you with your pathology report.  Expect your pathology report 2-3 business days after your surgery.  You may call to check if you do not hear from [place] after three days. OTHER INSTRUCTIONS:    WHEN TO CALL YOUR DOCTOR: 1. Fever over 101.0 2. Nausea and/or vomiting. 3. Extreme swelling or bruising. 4. Continued bleeding from incision. 5. Increased pain, redness, or drainage from the incision.  The clinic staff is available to answer your questions during regular business hours.  Please dont hesitate to call and ask to speak to one of the nurses for clinical concerns.  If you have a medical emergency, go to the nearest emergency room or call 911.  A surgeon from [facility] is always on call at the hospital.  For further questions, please visit [email or web address]  Care Instructions  Activity: Get plenty of rest for the remainder of the day. A responsible individual must stay with you for 24 hours following the procedure.  For the next 24 hours, DO NOT: -Drive a car -Paediatric nurse -Drink alcoholic beverages -Take any medication unless instructed by your  physician -Make any legal decisions or sign important papers.  Meals: Start with liquid foods such as gelatin or soup. Progress to regular foods as tolerated. Avoid greasy, spicy, heavy foods. If nausea and/or vomiting occur, drink only clear liquids until the nausea and/or vomiting subsides. Call your physician if vomiting continues.  Special Instructions/Symptoms: Your throat may feel dry or sore from the anesthesia or the breathing tube placed in your throat during surgery. If this causes discomfort, gargle with warm salt water. The discomfort should disappear within 24 hours.  If you had a scopolamine patch placed behind your ear for the management of post- operative nausea and/or vomiting:  1. The medication in the patch is effective for 72 hours, after which it should be removed.  Wrap patch in a tissue and discard in the trash. Wash hands thoroughly with soap and water. 2. You may remove the patch earlier than 72 hours if you experience unpleasant side effects which may include dry mouth, dizziness or visual disturbances. 3. Avoid touching the patch. Wash your hands with soap and water after contact with the patch.

## 2018-01-06 NOTE — Anesthesia Preprocedure Evaluation (Addendum)
Anesthesia Evaluation  Patient identified by MRN, date of birth, ID band Patient awake    Reviewed: Allergy & Precautions, NPO status , Patient's Chart, lab work & pertinent test results  History of Anesthesia Complications (+) PONV  Airway Mallampati: III  TM Distance: >3 FB Neck ROM: Full    Dental no notable dental hx.    Pulmonary neg pulmonary ROS,    Pulmonary exam normal breath sounds clear to auscultation       Cardiovascular hypertension, Pt. on home beta blockers and Pt. on medications Normal cardiovascular exam Rhythm:Regular Rate:Normal  ECG: NSR, rate 71   Neuro/Psych negative neurological ROS  negative psych ROS   GI/Hepatic negative GI ROS, Neg liver ROS,   Endo/Other  negative endocrine ROS  Renal/GU negative Renal ROS     Musculoskeletal negative musculoskeletal ROS (+)   Abdominal (+) + obese,   Peds  Hematology negative hematology ROS (+)   Anesthesia Other Findings PAPILLOMA  Reproductive/Obstetrics                            Anesthesia Physical Anesthesia Plan  ASA: II  Anesthesia Plan: General   Post-op Pain Management:    Induction: Intravenous  PONV Risk Score and Plan: 4 or greater and Midazolam, Dexamethasone, Ondansetron, Treatment may vary due to age or medical condition and Scopolamine patch - Pre-op  Airway Management Planned: LMA  Additional Equipment:   Intra-op Plan:   Post-operative Plan: Extubation in OR  Informed Consent: I have reviewed the patients History and Physical, chart, labs and discussed the procedure including the risks, benefits and alternatives for the proposed anesthesia with the patient or authorized representative who has indicated his/her understanding and acceptance.   Dental advisory given  Plan Discussed with: CRNA  Anesthesia Plan Comments:         Anesthesia Quick Evaluation

## 2018-01-07 ENCOUNTER — Encounter (HOSPITAL_BASED_OUTPATIENT_CLINIC_OR_DEPARTMENT_OTHER): Payer: Self-pay | Admitting: Surgery

## 2018-10-25 ENCOUNTER — Other Ambulatory Visit: Payer: Self-pay | Admitting: Hematology & Oncology

## 2018-10-25 DIAGNOSIS — Z853 Personal history of malignant neoplasm of breast: Secondary | ICD-10-CM

## 2018-12-30 ENCOUNTER — Ambulatory Visit
Admission: RE | Admit: 2018-12-30 | Discharge: 2018-12-30 | Disposition: A | Discharge status: Home / Self Care | Payer: Medicare Other | Source: Ambulatory Visit | Attending: Hematology & Oncology | Admitting: Hematology & Oncology

## 2018-12-30 ENCOUNTER — Other Ambulatory Visit: Payer: Self-pay

## 2018-12-30 DIAGNOSIS — Z853 Personal history of malignant neoplasm of breast: Secondary | ICD-10-CM

## 2019-12-07 ENCOUNTER — Other Ambulatory Visit: Payer: Self-pay | Admitting: Hematology & Oncology

## 2019-12-07 DIAGNOSIS — Z853 Personal history of malignant neoplasm of breast: Secondary | ICD-10-CM

## 2019-12-07 DIAGNOSIS — Z9889 Other specified postprocedural states: Secondary | ICD-10-CM

## 2020-01-30 ENCOUNTER — Other Ambulatory Visit: Payer: Self-pay

## 2020-01-30 ENCOUNTER — Other Ambulatory Visit: Payer: Self-pay | Admitting: Hematology & Oncology

## 2020-01-30 ENCOUNTER — Ambulatory Visit
Admission: RE | Admit: 2020-01-30 | Discharge: 2020-01-30 | Disposition: A | Discharge status: Home / Self Care | Payer: PRIVATE HEALTH INSURANCE | Source: Ambulatory Visit | Attending: Hematology & Oncology | Admitting: Hematology & Oncology

## 2020-01-30 DIAGNOSIS — Z9889 Other specified postprocedural states: Secondary | ICD-10-CM

## 2020-01-30 DIAGNOSIS — Z853 Personal history of malignant neoplasm of breast: Secondary | ICD-10-CM

## 2020-01-30 DIAGNOSIS — R921 Mammographic calcification found on diagnostic imaging of breast: Secondary | ICD-10-CM

## 2020-05-11 DEATH — deceased

## 2022-05-29 IMAGING — MG DIGITAL DIAGNOSTIC BILAT W/ TOMO W/ CAD
6 of 10 series · 6 of 26 positions shown · non-contrast
Comparison: Previous exam(s).

CLINICAL DATA: 69-year-old female status post malignant right
lumpectomy in 0585. The patient also had a stereotactic biopsy of
benign fat necrosis and dystrophic calcifications at the lumpectomy
site in 5148. An intraductal papilloma was diagnosed by ultrasound
at the 12 o'clock position of the right breast in 5148, and was
subsequently excised.

EXAM:
DIGITAL DIAGNOSTIC BILATERAL MAMMOGRAM WITH TOMO AND CAD

[R ML]
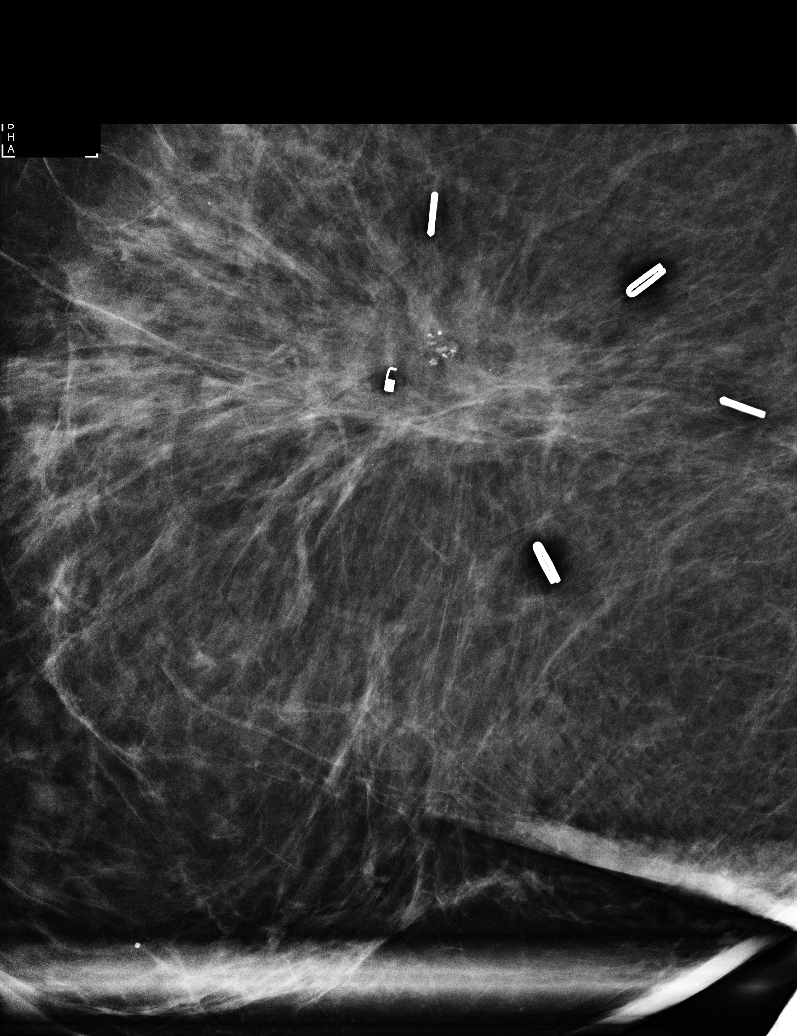

[R CC]
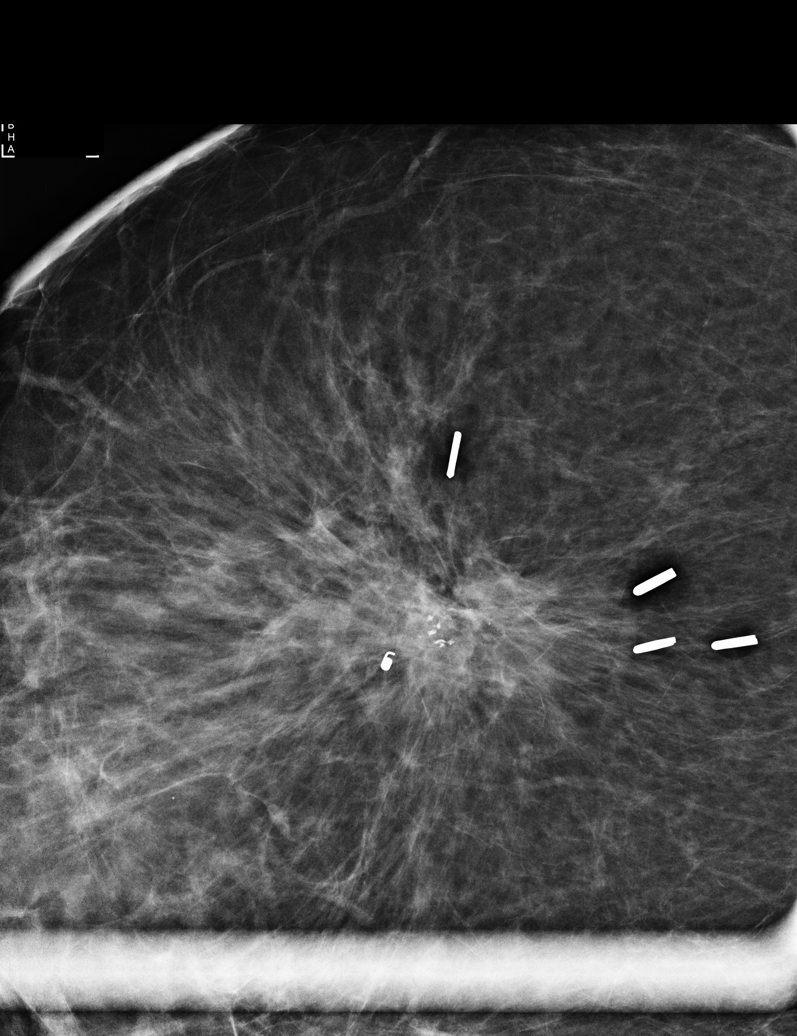

[L MLO synth-2D]
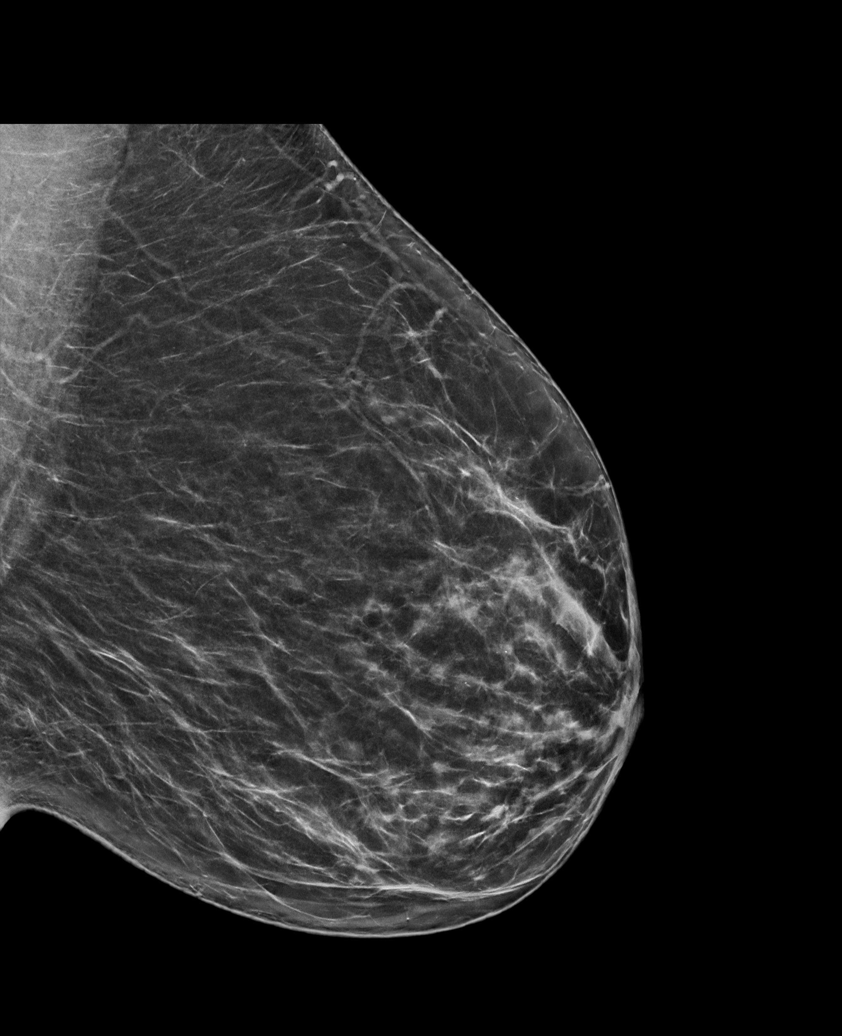

[R CC synth-2D]
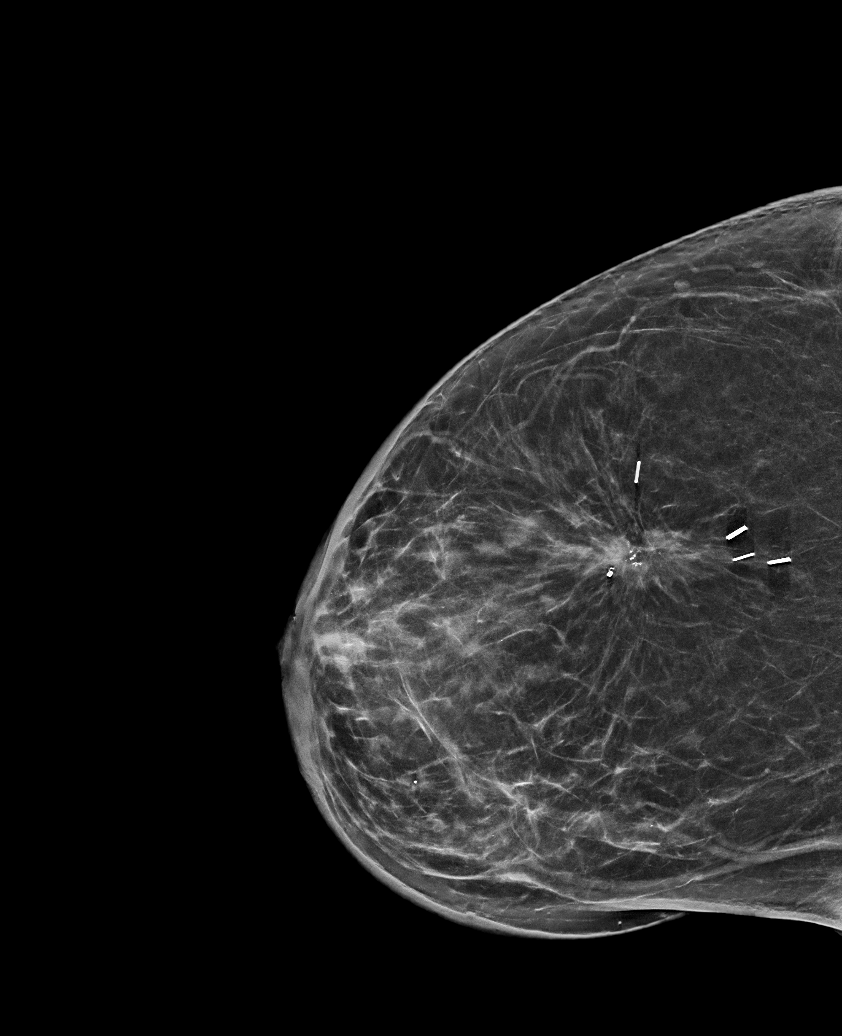

[L CC synth-2D]
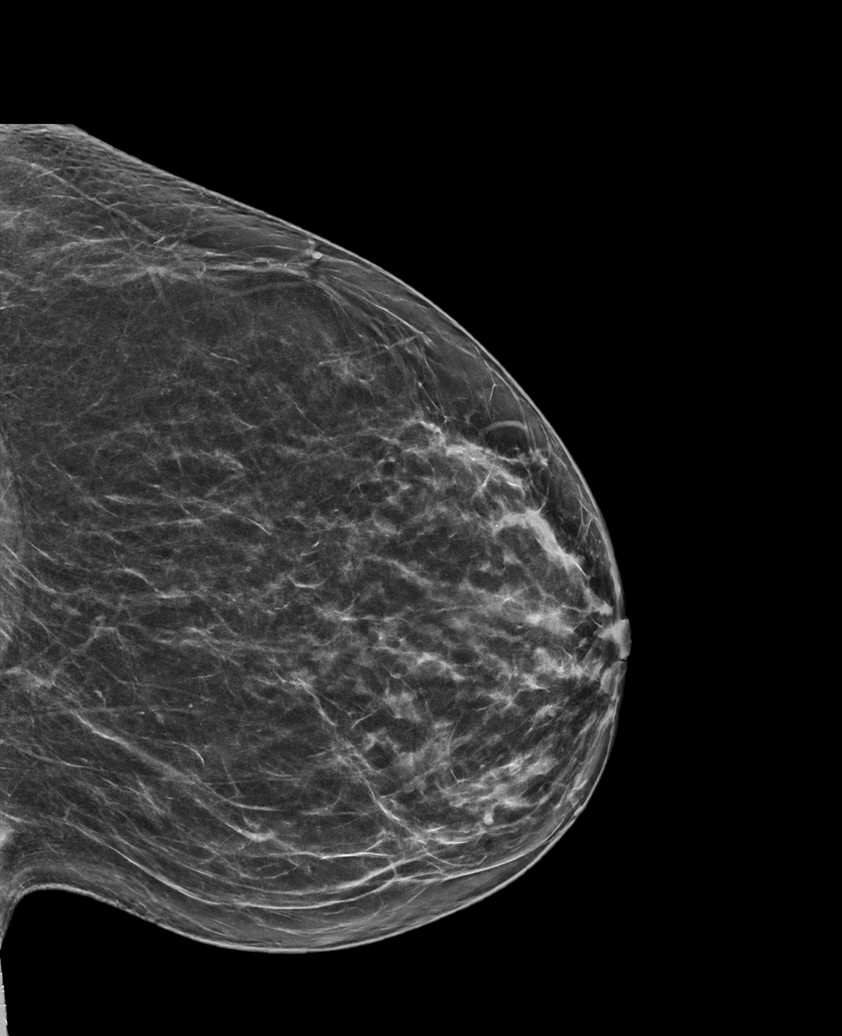

[R MLO synth-2D]
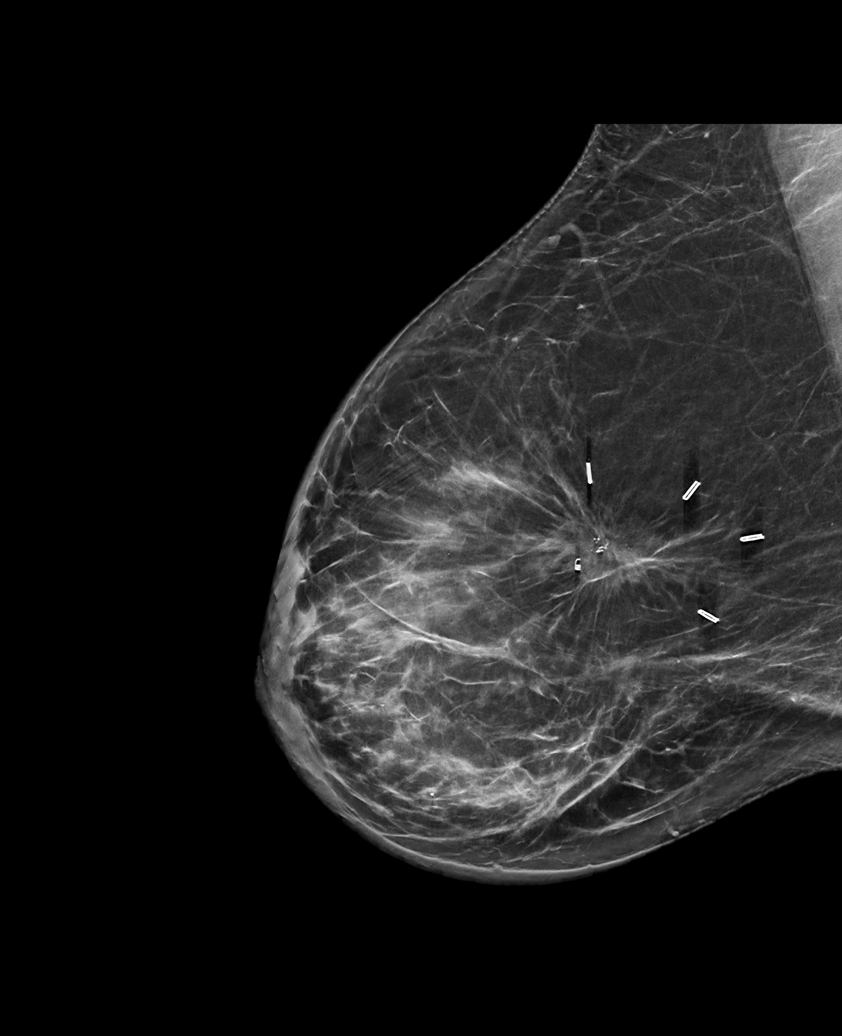

[6 of 26 positions shown; findings below may reference images not displayed]

ACR Breast Density Category c: The breast tissue is heterogeneously
dense, which may obscure small masses.
FINDINGS: Post lumpectomy changes are noted in the upper-outer quadrant of the
right breast posteriorly. There has been interval development of a 5
mm group of coarse calcifications in the lumpectomy bed, in close
approximation to the site of previously biopsied fat necrosis. No
new or additional suspicious findings are identified in the
remainder of either breast.

Mammographic images were processed with CAD.
IMPRESSION: 1. Probably benign right lumpectomy bed calcifications likely
representing early dystrophic calcifications. Recommendation is for
short-term imaging follow-up.
2. No mammographic evidence of malignancy on the left.

RECOMMENDATION:
Diagnostic right breast mammogram in 6 months.

I have discussed the findings and recommendations with the patient.
If applicable, a reminder letter will be sent to the patient
regarding the next appointment.

BI-RADS CATEGORY  3: Probably benign.
